# Patient Record
Sex: Male | Born: 1950 | Race: White | Hispanic: No | Marital: Married | Smoking: Former smoker
Health system: Southern US, Community
[De-identification: ages and names within clinical notes are randomized; demographics above are authoritative.]

## PROBLEM LIST (undated history)

## (undated) DIAGNOSIS — I10 Essential (primary) hypertension: Secondary | ICD-10-CM

## (undated) DIAGNOSIS — N400 Enlarged prostate without lower urinary tract symptoms: Secondary | ICD-10-CM

## (undated) DIAGNOSIS — G629 Polyneuropathy, unspecified: Secondary | ICD-10-CM

## (undated) DIAGNOSIS — M1990 Unspecified osteoarthritis, unspecified site: Secondary | ICD-10-CM

## (undated) DIAGNOSIS — M51379 Other intervertebral disc degeneration, lumbosacral region without mention of lumbar back pain or lower extremity pain: Secondary | ICD-10-CM

## (undated) DIAGNOSIS — R972 Elevated prostate specific antigen [PSA]: Secondary | ICD-10-CM

## (undated) DIAGNOSIS — E119 Type 2 diabetes mellitus without complications: Secondary | ICD-10-CM

## (undated) DIAGNOSIS — K219 Gastro-esophageal reflux disease without esophagitis: Secondary | ICD-10-CM

## (undated) DIAGNOSIS — R399 Unspecified symptoms and signs involving the genitourinary system: Secondary | ICD-10-CM

## (undated) DIAGNOSIS — N529 Male erectile dysfunction, unspecified: Secondary | ICD-10-CM

## (undated) DIAGNOSIS — K449 Diaphragmatic hernia without obstruction or gangrene: Secondary | ICD-10-CM

## (undated) DIAGNOSIS — M5137 Other intervertebral disc degeneration, lumbosacral region: Secondary | ICD-10-CM

## (undated) HISTORY — PX: INGUINAL HERNIA REPAIR: SUR1180

## (undated) HISTORY — PX: TONSILLECTOMY: SUR1361

---
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
## 2011-11-11 HISTORY — PX: INGUINAL HERNIA REPAIR: SUR1180

## 2013-05-13 HISTORY — PX: CATARACT EXTRACTION W/ INTRAOCULAR LENS IMPLANT: SHX1309

## 2019-01-12 DIAGNOSIS — Z85528 Personal history of other malignant neoplasm of kidney: Secondary | ICD-10-CM

## 2019-01-12 HISTORY — DX: Personal history of other malignant neoplasm of kidney: Z85.528

## 2019-02-02 ENCOUNTER — Other Ambulatory Visit: Payer: Self-pay | Admitting: Internal Medicine

## 2019-02-02 DIAGNOSIS — N2889 Other specified disorders of kidney and ureter: Secondary | ICD-10-CM

## 2019-02-03 ENCOUNTER — Ambulatory Visit
Admission: RE | Admit: 2019-02-03 | Discharge: 2019-02-03 | Disposition: A | Discharge status: Home / Self Care | Payer: Commercial Managed Care - PPO | Source: Ambulatory Visit | Attending: Internal Medicine | Admitting: Internal Medicine

## 2019-02-03 DIAGNOSIS — N2889 Other specified disorders of kidney and ureter: Secondary | ICD-10-CM

## 2019-02-03 MED ORDER — IOPAMIDOL (ISOVUE-300) INJECTION 61%
100.0000 mL | Freq: Once | INTRAVENOUS | Status: AC | PRN
  Administered 2019-02-03: 12:00:00 100 mL via INTRAVENOUS

## 2019-03-17 HISTORY — PX: ROBOTIC ASSITED PARTIAL NEPHRECTOMY: SHX6087

## 2019-12-28 ENCOUNTER — Other Ambulatory Visit: Payer: Self-pay | Admitting: Urology

## 2019-12-28 DIAGNOSIS — R972 Elevated prostate specific antigen [PSA]: Secondary | ICD-10-CM

## 2020-02-01 ENCOUNTER — Other Ambulatory Visit: Payer: Self-pay

## 2020-02-01 ENCOUNTER — Ambulatory Visit
Admission: RE | Admit: 2020-02-01 | Discharge: 2020-02-01 | Disposition: A | Discharge status: Home / Self Care | Payer: Commercial Managed Care - PPO | Source: Ambulatory Visit | Attending: Urology | Admitting: Urology

## 2020-02-01 DIAGNOSIS — R972 Elevated prostate specific antigen [PSA]: Secondary | ICD-10-CM

## 2020-02-01 MED ORDER — GADOBENATE DIMEGLUMINE 529 MG/ML IV SOLN
16.0000 mL | Freq: Once | INTRAVENOUS | Status: AC | PRN
  Administered 2020-02-01: 16 mL via INTRAVENOUS

## 2021-06-25 ENCOUNTER — Ambulatory Visit: Payer: Self-pay | Admitting: Surgery

## 2021-07-17 ENCOUNTER — Other Ambulatory Visit: Payer: Self-pay

## 2021-07-17 ENCOUNTER — Encounter (HOSPITAL_BASED_OUTPATIENT_CLINIC_OR_DEPARTMENT_OTHER): Payer: Self-pay | Admitting: Surgery

## 2021-07-17 NOTE — Progress Notes (Addendum)
Spoke w/ via phone for pre-op interview--- pt ?Lab needs dos----   [employer] and ekg            ?Lab results------ no ?COVID test -----patient states asymptomatic no test needed ?Arrive at ------- 0900 on 07-20-2021 ?NPO after MN NO Solid Food and no dip tobacco Clear liquids from MN until--- 0800 ?Med rec completed ?Medications to take morning of surgery ----- proscar, flomax, cialis ?Diabetic medication ----- do half dose lantus insulin morning of surgery and do not do  lispro insulin morning of surgery ?Patient instructed no nail polish to be worn day of surgery ?Patient instructed to bring photo id and insurance card day of surgery ?Patient aware to have Driver (ride ) / caregiver for 24 hours after surgery -- wife, cindee ?Patient Special Instructions ----- n/a ?Pre-Op special Istructions -----  pt has Libre II on left upper arm ?Patient verbalized understanding of instructions that were given at this phone interview. ?Patient denies shortness of breath, chest pain, fever, cough at this phone interview.  ?

## 2021-07-20 ENCOUNTER — Encounter (HOSPITAL_BASED_OUTPATIENT_CLINIC_OR_DEPARTMENT_OTHER)
Admission: RE | Disposition: A | Discharge status: Home / Self Care | Payer: Self-pay | Source: Home / Self Care | Attending: Surgery

## 2021-07-20 ENCOUNTER — Ambulatory Visit (HOSPITAL_BASED_OUTPATIENT_CLINIC_OR_DEPARTMENT_OTHER)
Admission: RE | Admit: 2021-07-20 | Discharge: 2021-07-20 | Disposition: A | Discharge status: Home / Self Care | Payer: Medicare Other | Attending: Surgery | Admitting: Surgery

## 2021-07-20 ENCOUNTER — Ambulatory Visit (HOSPITAL_BASED_OUTPATIENT_CLINIC_OR_DEPARTMENT_OTHER): Payer: Medicare Other | Admitting: Anesthesiology

## 2021-07-20 ENCOUNTER — Other Ambulatory Visit: Payer: Self-pay

## 2021-07-20 ENCOUNTER — Encounter (HOSPITAL_BASED_OUTPATIENT_CLINIC_OR_DEPARTMENT_OTHER): Payer: Self-pay | Admitting: Surgery

## 2021-07-20 DIAGNOSIS — Z85528 Personal history of other malignant neoplasm of kidney: Secondary | ICD-10-CM | POA: Insufficient documentation

## 2021-07-20 DIAGNOSIS — I10 Essential (primary) hypertension: Secondary | ICD-10-CM | POA: Diagnosis not present

## 2021-07-20 DIAGNOSIS — Z794 Long term (current) use of insulin: Secondary | ICD-10-CM | POA: Diagnosis not present

## 2021-07-20 DIAGNOSIS — K4090 Unilateral inguinal hernia, without obstruction or gangrene, not specified as recurrent: Secondary | ICD-10-CM | POA: Insufficient documentation

## 2021-07-20 DIAGNOSIS — Z905 Acquired absence of kidney: Secondary | ICD-10-CM | POA: Insufficient documentation

## 2021-07-20 DIAGNOSIS — K219 Gastro-esophageal reflux disease without esophagitis: Secondary | ICD-10-CM | POA: Insufficient documentation

## 2021-07-20 DIAGNOSIS — M1990 Unspecified osteoarthritis, unspecified site: Secondary | ICD-10-CM | POA: Diagnosis not present

## 2021-07-20 DIAGNOSIS — E1140 Type 2 diabetes mellitus with diabetic neuropathy, unspecified: Secondary | ICD-10-CM | POA: Insufficient documentation

## 2021-07-20 DIAGNOSIS — Z87891 Personal history of nicotine dependence: Secondary | ICD-10-CM | POA: Insufficient documentation

## 2021-07-20 HISTORY — DX: Gastro-esophageal reflux disease without esophagitis: K21.9

## 2021-07-20 HISTORY — DX: Benign prostatic hyperplasia without lower urinary tract symptoms: N40.0

## 2021-07-20 HISTORY — PX: INGUINAL HERNIA REPAIR: SHX194

## 2021-07-20 HISTORY — DX: Other intervertebral disc degeneration, lumbosacral region: M51.37

## 2021-07-20 HISTORY — DX: Other intervertebral disc degeneration, lumbosacral region without mention of lumbar back pain or lower extremity pain: M51.379

## 2021-07-20 HISTORY — DX: Essential (primary) hypertension: I10

## 2021-07-20 HISTORY — DX: Male erectile dysfunction, unspecified: N52.9

## 2021-07-20 HISTORY — DX: Diaphragmatic hernia without obstruction or gangrene: K44.9

## 2021-07-20 HISTORY — DX: Type 2 diabetes mellitus without complications: E11.9

## 2021-07-20 HISTORY — DX: Polyneuropathy, unspecified: G62.9

## 2021-07-20 HISTORY — DX: Unspecified osteoarthritis, unspecified site: M19.90

## 2021-07-20 HISTORY — DX: Unspecified symptoms and signs involving the genitourinary system: R39.9

## 2021-07-20 HISTORY — DX: Elevated prostate specific antigen (PSA): R97.20

## 2021-07-20 LAB — POCT I-STAT, CHEM 8
BUN: 13 mg/dL (ref 8–23)
Calcium, Ion: 1.21 mmol/L (ref 1.15–1.40)
Chloride: 100 mmol/L (ref 98–111)
Creatinine, Ser: 0.7 mg/dL (ref 0.61–1.24)
Glucose, Bld: 221 mg/dL — ABNORMAL HIGH (ref 70–99)
HCT: 40 % (ref 39.0–52.0)
Hemoglobin: 13.6 g/dL (ref 13.0–17.0)
Potassium: 4.5 mmol/L (ref 3.5–5.1)
Sodium: 139 mmol/L (ref 135–145)
TCO2: 29 mmol/L (ref 22–32)

## 2021-07-20 LAB — GLUCOSE, CAPILLARY: Glucose-Capillary: 200 mg/dL — ABNORMAL HIGH (ref 70–99)

## 2021-07-20 SURGERY — REPAIR, HERNIA, INGUINAL, LAPAROSCOPIC
Anesthesia: General | Laterality: Left

## 2021-07-20 MED ORDER — ONDANSETRON HCL 4 MG/2ML IJ SOLN: 4.0000 mg | Freq: Once | INTRAMUSCULAR | Status: DC | PRN

## 2021-07-20 MED ORDER — PROPOFOL 10 MG/ML IV BOLUS
INTRAVENOUS | Status: DC | PRN
  Administered 2021-07-20: 150 mg via INTRAVENOUS

## 2021-07-20 MED ORDER — CHLORHEXIDINE GLUCONATE CLOTH 2 % EX PADS: 6.0000 | MEDICATED_PAD | Freq: Once | CUTANEOUS | Status: DC

## 2021-07-20 MED ORDER — PHENYLEPHRINE 40 MCG/ML (10ML) SYRINGE FOR IV PUSH (FOR BLOOD PRESSURE SUPPORT)
PREFILLED_SYRINGE | INTRAVENOUS | Status: AC
  Filled 2021-07-20: qty 10

## 2021-07-20 MED ORDER — ONDANSETRON HCL 4 MG/2ML IJ SOLN
INTRAMUSCULAR | Status: AC
  Filled 2021-07-20: qty 2

## 2021-07-20 MED ORDER — PHENYLEPHRINE 40 MCG/ML (10ML) SYRINGE FOR IV PUSH (FOR BLOOD PRESSURE SUPPORT)
PREFILLED_SYRINGE | INTRAVENOUS | Status: DC | PRN
  Administered 2021-07-20: 80 ug via INTRAVENOUS
  Administered 2021-07-20: 120 ug via INTRAVENOUS
  Administered 2021-07-20: 80 ug via INTRAVENOUS

## 2021-07-20 MED ORDER — OXYCODONE HCL 5 MG PO TABS: 5.0000 mg | ORAL_TABLET | Freq: Once | ORAL | Status: DC | PRN

## 2021-07-20 MED ORDER — SUGAMMADEX SODIUM 200 MG/2ML IV SOLN
INTRAVENOUS | Status: DC | PRN
Start: 2021-07-20 — End: 2021-07-20
  Administered 2021-07-20: 150 mg via INTRAVENOUS

## 2021-07-20 MED ORDER — FENTANYL CITRATE (PF) 100 MCG/2ML IJ SOLN
INTRAMUSCULAR | Status: AC
  Filled 2021-07-20: qty 2

## 2021-07-20 MED ORDER — AMISULPRIDE (ANTIEMETIC) 5 MG/2ML IV SOLN: 10.0000 mg | Freq: Once | INTRAVENOUS | Status: DC | PRN

## 2021-07-20 MED ORDER — LACTATED RINGERS IV SOLN: INTRAVENOUS | Status: DC

## 2021-07-20 MED ORDER — MIDAZOLAM HCL 5 MG/5ML IJ SOLN
INTRAMUSCULAR | Status: DC | PRN
  Administered 2021-07-20: 1 mg via INTRAVENOUS

## 2021-07-20 MED ORDER — EPHEDRINE SULFATE (PRESSORS) 50 MG/ML IJ SOLN
INTRAMUSCULAR | Status: DC | PRN
Start: 2021-07-20 — End: 2021-07-20
  Administered 2021-07-20: 10 mg via INTRAVENOUS

## 2021-07-20 MED ORDER — DEXAMETHASONE SODIUM PHOSPHATE 10 MG/ML IJ SOLN
INTRAMUSCULAR | Status: AC
Start: 2021-07-20 — End: ?
  Filled 2021-07-20: qty 1

## 2021-07-20 MED ORDER — PROPOFOL 500 MG/50ML IV EMUL
INTRAVENOUS | Status: AC
  Filled 2021-07-20: qty 50

## 2021-07-20 MED ORDER — CELECOXIB 200 MG PO CAPS
ORAL_CAPSULE | ORAL | Status: AC
Start: 2021-07-20 — End: ?
  Filled 2021-07-20: qty 2

## 2021-07-20 MED ORDER — OXYCODONE HCL 5 MG/5ML PO SOLN: 5.0000 mg | Freq: Once | ORAL | Status: DC | PRN

## 2021-07-20 MED ORDER — ROCURONIUM BROMIDE 10 MG/ML (PF) SYRINGE
PREFILLED_SYRINGE | INTRAVENOUS | Status: AC
  Filled 2021-07-20: qty 10

## 2021-07-20 MED ORDER — ONDANSETRON HCL 4 MG/2ML IJ SOLN
INTRAMUSCULAR | Status: DC | PRN
  Administered 2021-07-20: 4 mg via INTRAVENOUS

## 2021-07-20 MED ORDER — EPHEDRINE 5 MG/ML INJ
INTRAVENOUS | Status: AC
  Filled 2021-07-20: qty 5

## 2021-07-20 MED ORDER — ACETAMINOPHEN 500 MG PO TABS
1000.0000 mg | ORAL_TABLET | ORAL | Status: AC
  Administered 2021-07-20: 1000 mg via ORAL

## 2021-07-20 MED ORDER — CELECOXIB 200 MG PO CAPS
400.0000 mg | ORAL_CAPSULE | ORAL | Status: AC
  Administered 2021-07-20: 400 mg via ORAL

## 2021-07-20 MED ORDER — FENTANYL CITRATE (PF) 100 MCG/2ML IJ SOLN
INTRAMUSCULAR | Status: DC | PRN
  Administered 2021-07-20 (×2): 25 ug via INTRAVENOUS
  Administered 2021-07-20: 50 ug via INTRAVENOUS

## 2021-07-20 MED ORDER — BUPIVACAINE LIPOSOME 1.3 % IJ SUSP: 20.0000 mL | Freq: Once | INTRAMUSCULAR | Status: DC

## 2021-07-20 MED ORDER — MIDAZOLAM HCL 2 MG/2ML IJ SOLN
INTRAMUSCULAR | Status: AC
  Filled 2021-07-20: qty 2

## 2021-07-20 MED ORDER — LIDOCAINE HCL (PF) 2 % IJ SOLN
INTRAMUSCULAR | Status: AC
  Filled 2021-07-20: qty 5

## 2021-07-20 MED ORDER — BUPIVACAINE HCL 0.5 % IJ SOLN
INTRAMUSCULAR | Status: DC | PRN
  Administered 2021-07-20: 30 mL

## 2021-07-20 MED ORDER — ACETAMINOPHEN 500 MG PO TABS
ORAL_TABLET | ORAL | Status: AC
  Filled 2021-07-20: qty 2

## 2021-07-20 MED ORDER — ROCURONIUM BROMIDE 10 MG/ML (PF) SYRINGE
PREFILLED_SYRINGE | INTRAVENOUS | Status: DC | PRN
  Administered 2021-07-20: 70 mg via INTRAVENOUS

## 2021-07-20 MED ORDER — INSULIN ASPART 100 UNIT/ML IJ SOLN
3.0000 [IU] | Freq: Once | INTRAMUSCULAR | Status: AC
  Administered 2021-07-20: 3 [IU] via SUBCUTANEOUS

## 2021-07-20 MED ORDER — OXYCODONE-ACETAMINOPHEN 5-325 MG PO TABS: 1.0000 | ORAL_TABLET | ORAL | 0 refills | Status: AC | PRN

## 2021-07-20 MED ORDER — CEFAZOLIN SODIUM-DEXTROSE 2-4 GM/100ML-% IV SOLN
INTRAVENOUS | Status: AC
  Filled 2021-07-20: qty 100

## 2021-07-20 MED ORDER — LIDOCAINE 2% (20 MG/ML) 5 ML SYRINGE
INTRAMUSCULAR | Status: DC | PRN
  Administered 2021-07-20: 80 mg via INTRAVENOUS

## 2021-07-20 MED ORDER — FENTANYL CITRATE (PF) 100 MCG/2ML IJ SOLN: 25.0000 ug | INTRAMUSCULAR | Status: DC | PRN

## 2021-07-20 MED ORDER — CEFAZOLIN SODIUM-DEXTROSE 2-4 GM/100ML-% IV SOLN
2.0000 g | INTRAVENOUS | Status: AC
  Administered 2021-07-20: 2 g via INTRAVENOUS

## 2021-07-20 MED ORDER — BUPIVACAINE LIPOSOME 1.3 % IJ SUSP
INTRAMUSCULAR | Status: DC | PRN
  Administered 2021-07-20: 20 mL

## 2021-07-20 SURGICAL SUPPLY — 40 items
ADH SKN CLS APL DERMABOND .7 (GAUZE/BANDAGES/DRESSINGS) ×1
APL PRP STRL LF DISP 70% ISPRP (MISCELLANEOUS) ×1
BLADE CLIPPER SENSICLIP SURGIC (BLADE) ×2 IMPLANT
CABLE HIGH FREQUENCY MONO STRZ (ELECTRODE) ×3 IMPLANT
CHLORAPREP W/TINT 26 (MISCELLANEOUS) ×3 IMPLANT
DECANTER SPIKE VIAL GLASS SM (MISCELLANEOUS) IMPLANT
DERMABOND ADVANCED (GAUZE/BANDAGES/DRESSINGS) ×2
DERMABOND ADVANCED .7 DNX12 (GAUZE/BANDAGES/DRESSINGS) ×1 IMPLANT
ELECT REM PT RETURN 9FT ADLT (ELECTROSURGICAL) ×3
ELECTRODE REM PT RTRN 9FT ADLT (ELECTROSURGICAL) ×1 IMPLANT
GAUZE 4X4 16PLY ~~LOC~~+RFID DBL (SPONGE) ×3 IMPLANT
GLOVE SRG 8 PF TXTR STRL LF DI (GLOVE) ×1 IMPLANT
GLOVE SURG ENC MOIS LTX SZ7.5 (GLOVE) ×3 IMPLANT
GLOVE SURG UNDER POLY LF SZ8 (GLOVE) ×3
GOWN STRL REUS W/ TWL XL LVL3 (GOWN DISPOSABLE) ×1 IMPLANT
GOWN STRL REUS W/TWL XL LVL3 (GOWN DISPOSABLE) ×3
GRASPER SUT TROCAR 14GX15 (MISCELLANEOUS) ×3 IMPLANT
IRRIG SUCT STRYKERFLOW 2 WTIP (MISCELLANEOUS)
IRRIGATION SUCT STRKRFLW 2 WTP (MISCELLANEOUS) IMPLANT
KIT TURNOVER CYSTO (KITS) ×3 IMPLANT
MESH 3DMAX 5X7 LT XLRG (Mesh General) ×2 IMPLANT
NEEDLE INSUFFLATION 120MM (ENDOMECHANICALS) ×3 IMPLANT
PACK BASIN DAY SURGERY FS (CUSTOM PROCEDURE TRAY) ×3 IMPLANT
PAD POSITIONING PINK XL (MISCELLANEOUS) ×3 IMPLANT
RELOAD STAPLE 4.0 BLU F/HERNIA (INSTRUMENTS) IMPLANT
RELOAD STAPLE 4.8 BLK F/HERNIA (STAPLE) IMPLANT
RELOAD STAPLE HERNIA 4.0 BLUE (INSTRUMENTS) ×3 IMPLANT
RELOAD STAPLE HERNIA 4.8 BLK (STAPLE) ×3 IMPLANT
SCISSORS LAP 5X35 DISP (ENDOMECHANICALS) ×3 IMPLANT
SET TUBE SMOKE EVAC HIGH FLOW (TUBING) ×3 IMPLANT
SPONGE T-LAP 18X18 ~~LOC~~+RFID (SPONGE) IMPLANT
STAPLER HERNIA 12 8.5 360D (INSTRUMENTS) ×2 IMPLANT
SUT MNCRL AB 4-0 PS2 18 (SUTURE) ×5 IMPLANT
SUT VICRYL 0 UR6 27IN ABS (SUTURE) ×2 IMPLANT
TOWEL OR 17X26 10 PK STRL BLUE (TOWEL DISPOSABLE) ×3 IMPLANT
TRAY FOL W/BAG SLVR 16FR STRL (SET/KITS/TRAYS/PACK) IMPLANT
TRAY FOLEY W/BAG SLVR 16FR LF (SET/KITS/TRAYS/PACK)
TRAY LAPAROSCOPIC (CUSTOM PROCEDURE TRAY) ×3 IMPLANT
TROCAR BLADELESS OPT 12M 100M (ENDOMECHANICALS) ×3 IMPLANT
TROCAR BLADELESS OPT 5 100 (ENDOMECHANICALS) ×6 IMPLANT

## 2021-07-20 NOTE — Anesthesia Procedure Notes (Signed)
Procedure Name: Intubation ?Date/Time: 07/20/2021 10:43 AM ?Performed by: Bonney Aid, CRNA ?Pre-anesthesia Checklist: Patient identified, Emergency Drugs available, Suction available and Patient being monitored ?Patient Re-evaluated:Patient Re-evaluated prior to induction ?Oxygen Delivery Method: Circle system utilized ?Preoxygenation: Pre-oxygenation with 100% oxygen ?Induction Type: IV induction ?Ventilation: Mask ventilation without difficulty ?Laryngoscope Size: Glidescope and 3 ?Grade View: Grade I ?Tube type: Oral ?Tube size: 7.5 mm ?Number of attempts: 1 ?Airway Equipment and Method: Stylet ?Placement Confirmation: ETT inserted through vocal cords under direct vision, positive ETCO2 and breath sounds checked- equal and bilateral ?Secured at: 22 cm ?Tube secured with: Tape ?Dental Injury: Teeth and Oropharynx as per pre-operative assessment  ? ? ? ? ?

## 2021-07-20 NOTE — Discharge Instructions (Addendum)
 GROIN HERNIA REPAIR POST OPERATIVE INSTRUCTIONS  Thinking Clearly  The anesthesia may cause you to feel different for 1 or 2 days. Do not drive, drink alcohol, or make any big decisions for at least 2 days.  Nutrition When you wake up, you will be able to drink small amounts of liquid. If you do not feel sick, you can slowly advance your diet to regular foods. Continue to drink lots of fluids, usually about 8 to 10 glasses per day. Eat a high-fiber diet so you don't strain during bowel movements. High-Fiber Foods Foods high in fiber include beans, bran cereals and whole-grain breads, peas, dried fruit (figs, apricots, and dates), raspberries, blackberries, strawberries, sweet corn, broccoli, baked potatoes with skin, plums, pears, apples, greens, and nuts. Activity Slowly increase your activity. Be sure to get up and walk every hour or so to prevent blood clots. No heavy lifting or strenuous activity for 4 weeks following surgery to prevent hernias at your incision sites or recurrence of your hernia. It is normal to feel tired. You may need more sleep than usual.  Get your rest but make sure to get up and move around frequently to prevent blood clots and pneumonia.  Work and Return to School You can go back to work when you feel well enough. Discuss the timing with your surgeon. You can usually go back to school or work 1 week or less after an laparoscopic or an open repair. If your work requires heavy lifting or strenuous activity you need to be placed on light duty for 4 weeks following surgery. You can return to gym class, sports or other physical activities 4 weeks after surgery.  Wound Care You may experience significant bruising in the groin including into the scrotum in males.  Rest, elevating the groin and scrotum above the level of the heart, ice and compression with tight fitting underwear can help.  Always wash your hands before and after touching near your incision site. Do  not soak in a bathtub until cleared at your follow up appointment. You may take a shower 24 hours after surgery. A small amount of drainage from the incision is normal. If the drainage is thick and yellow or the site is red, you may have an infection, so call your surgeon. If you have a drain in one of your incisions, it will be taken out in office when the drainage stops. Steri-Strips will fall off in 7 to 10 days or they will be removed during your first office visit. If you have dermabond glue covering over the incision, allow the glue to flake off on its own. Protect the new skin, especially from the sun. The sun can burn and cause darker scarring. Your scar will heal in about 4 to 6 weeks and will become softer and continue to fade over the next year.  The cosmetic appearance of the incisions will improve over the course of the first year after surgery. Sensation around your incision will return in a few weeks or months.  Bowel Movements After intestinal surgery, you may have loose watery stools for several days. If watery diarrhea lasts longer than 3 days, contact your surgeon. Pain medication (narcotics) can cause constipation. Increase the fiber in your diet with high-fiber foods if you are constipated. You can take an over the counter stool softener like Colace to avoid constipation.  Additional over the counter medications can also be used if Colace isn't sufficient (for example, Milk of Magnesia or Miralax).    Pain The amount of pain is different for each person. Some people need only 1 to 3 doses of pain control medication, while others need more. Take alternating doses of tylenol and ibuprofen around the clock for the first five days following surgery.  This will provide a baseline of pain control and help with inflammation.  Take the narcotic pain medication in addition if needed for severe pain.  Contact Your Surgeon at [phone], if you have: Pain that will not go away Pain that  gets worse A fever of more than 101F (38.3C) Repeated vomiting Swelling, redness, bleeding, or bad-smelling drainage from your wound site Strong abdominal pain No bowel movement or unable to pass gas for 3 days Watery diarrhea lasting longer than 3 days  Pain Control The goal of pain control is to minimize pain, keep you moving and help you heal. Your surgical team will work with you on your pain plan. Most often a combination of therapies and medications are used to control your pain. You may also be given medication (local anesthetic) at the surgical site. This may help control your pain for several days. Extreme pain puts extra stress on your body at a time when your body needs to focus on healing. Do not wait until your pain has reached a level "10" or is unbearable before telling your doctor or nurse. It is much easier to control pain before it becomes severe. Following a laparoscopic procedure, pain is sometimes felt in the shoulder. This is due to the gas inserted into your abdomen during the procedure. Moving and walking helps to decrease the gas and the right shoulder pain.  Use the guide below for ways to manage your post-operative pain. Learn more by going to facs.org/safepaincontrol.  How Intense Is My Pain Common Therapies to Feel Better       I hardly notice my pain, and it does not interfere with my activities.  I notice my pain and it distracts me, but I can still do activities (sitting up, walking, standing).  Non-Medication Therapies  Ice (in a bag, applied over clothing at the surgical site), elevation, rest, meditation, massage, distraction (music, TV, play) walking and mild exercise Splinting the abdomen with pillows +  Non-Opioid Medications Acetaminophen (Tylenol) Non-steroidal anti-inflammatory drugs (NSAIDS) Aspirin, Ibuprofen (Motrin, Advil) Naproxen (Aleve) Take these as needed, when you feel pain. Both acetaminophen and NSAIDs help to decrease pain  and swelling (inflammation).      My pain is hard to ignore and is more noticeable even when I rest.  My pain interferes with my usual activities.  Non-Medication Therapies  +  Non-Opioid medications  Take on a regular schedule (around-the-clock) instead of as needed. (For example, Tylenol every 6 hours at 9:00 am, 3:00 pm, 9:00 pm, 3:00 am and Motrin every 6 hours at 12:00 am, 6:00 am, 12:00 pm, 6:00 pm)         I am focused on my pain, and I am not doing my daily activities.  I am groaning in pain, and I cannot sleep. I am unable to do anything.  My pain is as bad as it could be, and nothing else matters.  Non-Medication Therapies  +  Around-the-Clock Non-Opioid Medications  +  Short-acting opioids  Opioids should be used with other medications to manage severe pain. Opioids block pain and give a feeling of euphoria (feel high). Addiction, a serious side effect of opioids, is rare with short-term (a few days) use.  Examples of short-acting opioids   include: Tramadol (Ultram), Hydrocodone (Norco, Vicodin), Hydromorphone (Dilaudid), Oxycodone (Oxycontin)     The above directions have been adapted from the [employer] Surgical Patient Education Program.  Please refer to the ACS website if needed: http://chapman.info/.ashx   Ivar Drape, MD [facility] [address], [place], [place]  [address] ?  [address] [address], [place], [place]   [address] [phone] ? [phone] ? FAX [phone] Web site: [email or web address]     NO TYLENOL PRODUCTS UNTIL AFTER 3:40 PM THIS AFTERNOON.  NO IBUPROFEN PRODUCTS UNTIL AFTER 3:40 PM THIS AFTERNOON. (IBUPROFEN, ADVIL, MOTRIN, ALEVE, NAPROXEN, MELOXICAM, CELEBREX)     Post Anesthesia Home Care Instructions  Activity: Get plenty of rest for the remainder of the day. A responsible individual must stay  with you for 24 hours following the procedure.  For the next 24 hours, DO NOT: -Drive a car -Advertising copywriter -Drink alcoholic beverages -Take any medication unless instructed by your physician -Make any legal decisions or sign important papers.  Meals: Start with liquid foods such as gelatin or soup. Progress to regular foods as tolerated. Avoid greasy, spicy, heavy foods. If nausea and/or vomiting occur, drink only clear liquids until the nausea and/or vomiting subsides. Call your physician if vomiting continues.  Special Instructions/Symptoms: Your throat may feel dry or sore from the anesthesia or the breathing tube placed in your throat during surgery. If this causes discomfort, gargle with warm salt water. The discomfort should disappear within 24 hours.      Information for Discharge Teaching: EXPAREL (bupivacaine liposome injectable suspension)   Your surgeon or anesthesiologist gave you EXPAREL(bupivacaine) to help control your pain after surgery.  EXPAREL is a local anesthetic that provides pain relief by numbing the tissue around the surgical site. EXPAREL is designed to release pain medication over time and can control pain for up to 72 hours. Depending on how you respond to EXPAREL, you may require less pain medication during your recovery.  Possible side effects: Temporary loss of sensation or ability to move in the area where bupivacaine was injected. Nausea, vomiting, constipation Rarely, numbness and tingling in your mouth or lips, lightheadedness, or anxiety may occur. Call your doctor right away if you think you may be experiencing any of these sensations, or if you have other questions regarding possible side effects.  Follow all other discharge instructions given to you by your surgeon or nurse. Eat a healthy diet and drink plenty of water or other fluids.  If you return to the hospital for any reason within 96 hours following the administration of EXPAREL, it is  important for health care providers to know that you have received this anesthetic. A teal colored band has been placed on your arm with the date, time and amount of EXPAREL you have received in order to alert and inform your health care providers. Please leave this armband in place for the full 96 hours following administration, and then you may remove the band.

## 2021-07-20 NOTE — H&P (Signed)
? ?Admitting Physician: Nickola Major Judge Duque ? ?Service: General surgery ? ?CC: Left inguinal hernia ? ?Subjective  ? ?HPI: ?Curtis Hutchinson is an 71 y.o. male who is here for left inguinal hernia repair. ? ?Past Medical History:  ?Diagnosis Date  ? BPH (benign prostatic hyperplasia)   ? followed by urologist --- Curtis Hutchinson in [place]  ? DDD (degenerative disc disease), lumbosacral   ? ED (erectile dysfunction)   ? Elevated PSA   ? GERD (gastroesophageal reflux disease)   ? Hiatal hernia   ? History of renal cell carcinoma 01/2019  ? urologist--- [place] Hutch[place] ([facility] [place]  dx 09/ 2020 incidental finding on mri lumbar imageing;   03-17-2019  s/p right partial nephrectomy  ? Hypertension   ? Insulin dependent type 2 diabetes mellitus (Curtis Hutchinson)   ? followed by pcp  (07-17-2021  checks 5-6 times daily with Libre II,  fasting sugar--- 120-150)  ? Lower urinary tract symptoms (LUTS)   ? OA (osteoarthritis)   ? Peripheral neuropathy   ? ? ?Past Surgical History:  ?Procedure Laterality Date  ? APPENDECTOMY  1966  ? CATARACT EXTRACTION W/ INTRAOCULAR LENS IMPLANT Bilateral 2015  ? INGUINAL HERNIA REPAIR Right 11/2011  ? INGUINAL HERNIA REPAIR Left   ? age 31  ? ROBOTIC ASSITED PARTIAL NEPHRECTOMY Right 03/17/2019  ? @[facility]   ? TONSILLECTOMY    ? child  ? ? ?History reviewed. No pertinent family history. ? ?Social:  reports that he quit smoking about 38 years ago. His smoking use included cigarettes. His smokeless tobacco use includes snuff. He reports current alcohol use of about 7.0 standard drinks per week. He reports that he does not use drugs. ? ?Allergies: No Known Allergies ? ?Medications: ?Current Outpatient Medications  ?Medication Instructions  ? amLODipine (NORVASC) 2.5 mg, Oral, Daily  ? aspirin 325 mg, Oral, As needed  ? atorvastatin (LIPITOR) 40 mg, Oral, Daily at bedtime  ? finasteride (PROSCAR) 5 mg, Oral, Daily  ? insulin glargine (LANTUS) 20 Units, Subcutaneous, Daily  ? insulin lispro (HUMALOG) 100 UNIT/ML  KwikPen Subcutaneous, 5 times daily, PER SS  ? lisinopril-hydrochlorothiazide (ZESTORETIC) 20-12.5 MG tablet 1 tablet, Oral, 2 times daily  ? naproxen sodium (ALEVE) 220 mg, Oral, 2 times daily PRN  ? omeprazole (PRILOSEC) 20 mg, Oral, Daily at bedtime  ? tadalafil (CIALIS) 5 mg, Oral, Daily  ? tamsulosin (FLOMAX) 0.4 mg, Oral, 2 times daily  ? ? ?ROS - all of the below systems have been reviewed with the patient and positives are indicated with bold text ?General: chills, fever or night sweats ?Eyes: blurry vision or double vision ?ENT: epistaxis or sore throat ?Allergy/Immunology: itchy/watery eyes or nasal congestion ?Hematologic/Lymphatic: bleeding problems, blood clots or swollen lymph nodes ?Endocrine: temperature intolerance or unexpected weight changes ?Breast: new or changing breast lumps or nipple discharge ?Resp: cough, shortness of breath, or wheezing ?CV: chest pain or dyspnea on exertion ?GI: as per HPI ?GU: dysuria, trouble voiding, or hematuria ?MSK: joint pain or joint stiffness ?Neuro: TIA or stroke symptoms ?Derm: pruritus and skin lesion changes ?Psych: anxiety and depression ? ?Objective  ? ?PE ?Blood pressure (!) 171/90, pulse 93, temperature 98.2 ?F (36.8 ?C), temperature source Oral, resp. rate 16, height 5\' 8"  (1.727 m), weight 71.1 kg, SpO2 99 %. ?Constitutional: NAD; conversant; no deformities ?Eyes: Moist conjunctiva; no lid lag; anicteric; PERRL ?Neck: Trachea midline; no thyromegaly ?Lungs: Normal respiratory effort; no tactile fremitus ?CV: RRR; no palpable thrills; no pitting edema ?GI: Abd Left  inguinal hernia marked; no palpable hepatosplenomegaly ?MSK: Normal range of motion of extremities; no clubbing/cyanosis ?Psychiatric: Appropriate affect; alert and oriented x3 ?Lymphatic: No palpable cervical or axillary lymphadenopathy ? ?Results for orders placed or performed during the hospital encounter of 07/20/21 (from the past 24 hour(s))  ?I-STAT, chem 8     Status: Abnormal  ?  Collection Time: 07/20/21  9:36 AM  ?Result Value Ref Range  ? Sodium 139 135 - 145 mmol/L  ? Potassium 4.5 3.5 - 5.1 mmol/L  ? Chloride 100 98 - 111 mmol/L  ? BUN 13 8 - 23 mg/dL  ? Creatinine, Ser 0.70 0.61 - 1.24 mg/dL  ? Glucose, Bld 221 (H) 70 - 99 mg/dL  ? Calcium, Ion 1.21 1.15 - 1.40 mmol/L  ? TCO2 29 22 - 32 mmol/L  ? Hemoglobin 13.6 13.0 - 17.0 g/dL  ? HCT 40.0 39.0 - 52.0 %  ? ? ?Imaging Orders  ?No imaging studies ordered today  ? ?MRI Prostate reviewed 02/01/20 ?No radiographic evidence of high-grade prostate carcinoma. PI-RADS ?1: Very Low (clinically significant cancer is highly unlikely to be ?Present) ? ?- looks to have fat containing left inguinal hernia with direct defect on this image ? ?Assessment and Plan  ? ?Curtis Hutchinson is an 71 y.o. male with a left inguinal hernia here for laparoscopic repair.  The procedure as well as its risks, benefits and alternatives were discussed with the patient. A fter a full discussion and all questions answered the patient granted consent to proceed.  We will proceed as scheduled. ? ?Curtis Morn, MD ? ?[facility] ?Use [email or web address] to contact on call provider ? ? ? ?

## 2021-07-20 NOTE — Transfer of Care (Signed)
Immediate Anesthesia Transfer of Care Note ? ?Patient: Curtis Hutchinson ? ?Procedure(s) Performed: LAPAROSCOPIC LEFT INGUINAL HERNIA REPAIR WITH MESH (Left) ? ?Patient Location: PACU ? ?Anesthesia Type:General ? ?Level of Consciousness: drowsy and patient cooperative ? ?Airway & Oxygen Therapy: Patient Spontanous Breathing and Patient connected to face mask oxygen ? ?Post-op Assessment: Report given to RN and Post -op Vital signs reviewed and stable ? ?Post vital signs: Reviewed and stable ? ?Last Vitals:  ?Vitals Value Taken Time  ?BP    ?Temp    ?Pulse 88 07/20/21 1158  ?Resp 16 07/20/21 1158  ?SpO2 99 % 07/20/21 1158  ?Vitals shown include unvalidated device data. ? ?Last Pain:  ?Vitals:  ? 07/20/21 0926  ?TempSrc: Oral  ?PainSc: 0-No pain  ?   ? ?  ? ?Complications: No notable events documented. ?

## 2021-07-20 NOTE — Op Note (Signed)
? ?  Patient: Curtis Hutchinson (07-01-1950, [id number]) ? ?Date of Surgery: 07/20/2021  ? ?Preoperative Diagnosis: LEFT INGUINAL HERNIA  ? ?Postoperative Diagnosis: LEFT INGUINAL HERNIA  ? ?Surgical Procedure: LAPAROSCOPIC LEFT INGUINAL HERNIA REPAIR WITH MESH: [id number]  ? ?Operative Team Members:  ?Surgeon(s) and Role: ?   * Keiva Dina, Hyman Hopes, MD - Primary  ? ?Anesthesiologist: Mellody Dance, MD ?CRNA: Marny Lowenstein, CRNA; Briant Sites, CRNA  ? ?Anesthesia: General  ? ?Fluids:  ?Total I/O ?In: 850 [I.V.:750; IV Piggyback:100] ?Out: 10 [Blood:10] ? ?Complications: None ? ?Drains:  None ? ?Specimen: None ? ?Disposition:  PACU - hemodynamically stable. ? ?Plan of Care: Discharge to home after PACU ? ?Indications for Procedure: Curtis Hutchinson is a 71 y.o. male who presented with a left inguinal hernia.  I recommended laparoscopic repair.  The procedure itself as well as its risks, benefits and alternatives were discussed and the patient granted consent to proceed. ? ?Findings:  ?Technique: Transabdominal preperitoneal (TAPP) ?Hernia Location: Left indirect inguinal hernia containing sigmoid colon ?Mesh Size &Type:  BARD 3D Max Extra-Large Left Sided Mesh ?Mesh Fixation: Endo-Universal hernia stapler ? ?Infection status: ?Patient: Private Patient Elective Case ?Case: Elective ?Infection Present At Time Of Surgery (PATOS): None ? ? ?Description of Procedure: ? ?The patient was positioned supine, padded and secured to the bed, with both arms tucked.  The abdomen was widely prepped and draped.  A time out procedure was performed.  A 1 cm infraumbilical incision was made.  The abdomen was entered without trauma to the underlying viscera.  The abdomen was insufflated to 15 mm of Hg.  A 12 mm trocar was inserted at the periumbilical incision.  Additional 5 mm trocars were placed in the left and right abdomen.  There was no trauma to the underlying viscera. ? ?There was an indirect hernia on the left.  The sigmoid  colon was scarred to the hernia sac.  These adhesions were lysed sharply prior to dissecting out the hernia to protect the colon.  With the colon mobilized out of the hernia sac, I utilized a transabdominal pre peritoneal technique (TAPP), a horizontal incision was made in the peritoneum, immediately below the umbilicus.  Dissection was carried out in the pre peritoneal space down to the level of the hernia sac which was reduced into the peritoneal cavity completely.  The cord contents were parietalized and preserved.  A large pre peritoneal dissection was performed to uncover the direct, indirect, femoral and obturator spaces.  Cooper?s ligament was uncovered medially and the psoas muscle uncovered laterally. ? ?The mesh, as documented above, was opened and advanced into the pre peritoneal position so that it more than adequately covered the indirect, direct, femoral and obturator spaces.  The mesh laid flat, with no inferior folds and covered the entire myopectineal orifice.  The mesh was fixated with the endo-universal hernia stapler to Cooper?s ligament and the posterior aspect of the rectus muscle.  The peritoneal flap was closed with the same device.  There were no peritoneal defects or exposed mesh at the conclusion. ? ?The umbilical trocar was removed and the fascial defect was closed with a 0 Vicryl suture.  The peritoneal cavity was completely desufflated, the trocars removed and the skin closed with 4-0 Monocryl subcuticular suture and skin glue.  All sponge and needle counts were correct at the end of the case. ? ?Ivar Drape, MD ?General, Bariatric, & Minimally Invasive Surgery ?[facility], [place] ? ?

## 2021-07-20 NOTE — Anesthesia Preprocedure Evaluation (Addendum)
Anesthesia Evaluation  ?Patient identified by MRN, date of birth, ID band ?Patient awake ? ? ? ?Reviewed: ?Allergy & Precautions, NPO status , Patient's Chart, lab work & pertinent test results ? ?History of Anesthesia Complications ?(+) DIFFICULT AIRWAY and history of anesthetic complications (Patient states he has a difficult intubation letter from his nephrectomy surgery which he did not bring) ? ?Airway ?Mallampati: II ? ?TM Distance: >3 FB ?Neck ROM: Full ? ? ?Comment: Airway note from nephrectomy in 2020:  ?Endotracheal tube insertion site: oral ?Blade: Macintosh ?Blade size: #3 ?ETT size: 7.0 mm ?Cuffed: yes ?View (Cormack Lehane grade): grade III - view of epiglottis only ?Placement verified by: chest auscultation/breath sounds equal bilaterally and capnometry/+EtCO2  ?Measured from: lips ?ETT to lips (cm): 23 ? ?Number of attempts at approach: 3 or more ?Ventilation between attempts: none ?Number of other approaches attempted: 3 or more ?Airway placement trauma: none ?Additional Comments ?First attempt by [facility]-1 with Mac 3. Only able to obtain g3v, anterior airway. Second attempt with Sabra Heck 2 by CA-1, large epiglottis continuously dropping into view. Third attempt by CA-1 with Mac 3 and bougie, unable to feel tracheal rings. Fourth attempt by Venora Maples, MD with Mac 3, g3v. Fifth attempt by [facility] with Sabra Heck 2, large epiglottis. Final attempt by [facility] with Mac 3, g3v. Airway much more anterior than suspected based on airway exam (TM 3). Would recommend Glidescope/CMAC with D blade for future initial attempts.  ? Dental ?no notable dental hx. ? ?  ?Pulmonary ?neg pulmonary ROS, former smoker,  ?  ?Pulmonary exam normal ?breath sounds clear to auscultation ? ? ? ? ? ? Cardiovascular ?hypertension, negative cardio ROS ?Normal cardiovascular exam ?Rhythm:Regular Rate:Normal ? ? ?  ?Neuro/Psych ? Neuromuscular disease (peripheral neuropathy) negative psych ROS  ? GI/Hepatic ?Neg  liver ROS, hiatal hernia, GERD  ,  ?Endo/Other  ?negative endocrine ROSdiabetes, Type 2, Insulin Dependent ? Renal/GU ?Renal disease (h/o renal cell cancer s/p right partial nephrectomy)  ?negative genitourinary ?  ?Musculoskeletal ? ?(+) Arthritis ,  ? Abdominal ?  ?Peds ?negative pediatric ROS ?(+)  Hematology ?negative hematology ROS ?(+)   ?Anesthesia Other Findings ? ? Reproductive/Obstetrics ?negative OB ROS ? ?  ? ? ? ? ? ? ? ? ? ? ? ? ? ?  ?  ? ? ? ? ? ? ? ?Anesthesia Physical ?Anesthesia Plan ? ?ASA: 2 ? ?Anesthesia Plan: General  ? ?Post-op Pain Management:   ? ?Induction: Intravenous ? ?PONV Risk Score and Plan: 2 and Treatment may vary due to age or medical condition, Ondansetron and Dexamethasone ? ?Airway Management Planned: Oral ETT and Video Laryngoscope Planned ? ?Additional Equipment: None ? ?Intra-op Plan:  ? ?Post-operative Plan: Extubation in OR ? ?Informed Consent: I have reviewed the patients History and Physical, chart, labs and discussed the procedure including the risks, benefits and alternatives for the proposed anesthesia with the patient or authorized representative who has indicated his/her understanding and acceptance.  ? ? ? ?Dental advisory given ? ?Plan Discussed with: CRNA, Anesthesiologist and Surgeon ? ?Anesthesia Plan Comments: (Glidescope given history of difficult intubation in 2020. Norton Blizzard, MD  ?)  ? ? ? ? ? ?Anesthesia Quick Evaluation ? ?

## 2021-07-20 NOTE — Anesthesia Postprocedure Evaluation (Signed)
Anesthesia Post Note ? ?Patient: Curtis Hutchinson ? ?Procedure(s) Performed: LAPAROSCOPIC LEFT INGUINAL HERNIA REPAIR WITH MESH (Left) ? ?  ? ?Patient location during evaluation: PACU ?Anesthesia Type: General ?Level of consciousness: awake ?Pain management: pain level controlled ?Vital Signs Assessment: post-procedure vital signs reviewed and stable ?Respiratory status: spontaneous breathing and respiratory function stable ?Cardiovascular status: stable ?Postop Assessment: no apparent nausea or vomiting ?Anesthetic complications: no ? ? ?No notable events documented. ? ?Last Vitals:  ?Vitals:  ? 07/20/21 1215 07/20/21 1230  ?BP: 140/78 (!) 142/77  ?Pulse: 87 84  ?Resp: 17 10  ?Temp:    ?SpO2: 96% 94%  ?  ?Last Pain:  ?Vitals:  ? 07/20/21 1230  ?TempSrc:   ?PainSc: 0-No pain  ? ? ?  ?  ?  ?  ?  ?  ? ?Mellody Dance ? ? ? ? ?

## 2021-07-23 ENCOUNTER — Encounter (HOSPITAL_BASED_OUTPATIENT_CLINIC_OR_DEPARTMENT_OTHER): Payer: Self-pay | Admitting: Surgery

## 2022-02-12 IMAGING — MR MR PROSTATE WO/W CM
12 series · 48 of 48 positions shown · IV contrast (multihance)
Comparison: None.

CLINICAL DATA: Elevated PSA.

EXAM:
MR PROSTATE WITHOUT AND WITH CONTRAST
TECHNIQUE: Multiplanar multisequence MRI images were obtained of the pelvis
centered about the prostate. Pre and post contrast images were
obtained.
CONTRAST:  16mL MULTIHANCE GADOBENATE DIMEGLUMINE 529 MG/ML IV SOLN

[Series 3: T2 · coronal · 3.0mm · 0.56mm/px · 1 of 23 slices shown (1 of 3)]
[im 1/23]
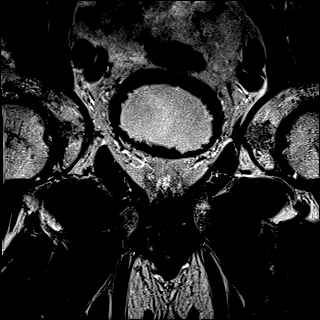

[Series 4: T1 · axial · 5.0mm · 1.25mm/px · 1 of 80 slices shown]
[im 1/80]
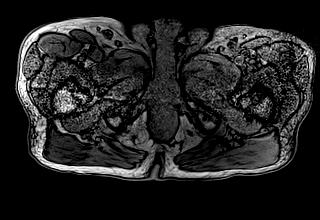

[Series 5: DWI · axial · 3.0mm · 1.75mm/px · 1 of 93 slices shown (1 of 3)]
[im 1/93]
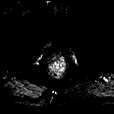

[Series 6: DWI · axial · 3.0mm · 1.75mm/px · 1 of 31 slices shown (2 of 3)]
[im 1/31]
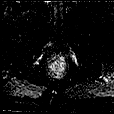

[Series 7: DWI · axial · 3.0mm · 1.75mm/px · 1 of 31 slices shown (3 of 3)]
[im 1/31]
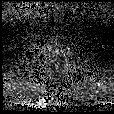

[Series 8: T2 · axial · 3.0mm · 0.56mm/px · 1 of 28 slices shown (2 of 3)]
[im 1/28]
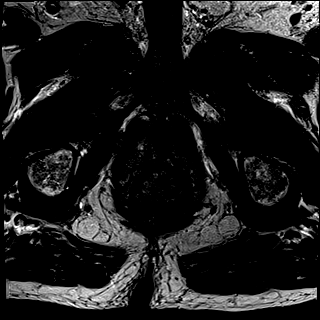

[Series 9: T2 · axial · 1.0mm · 1.04mm/px · z∈[-19,+68]mm · 2 of 88 slices shown (3 of 3)]
[im 1/88]
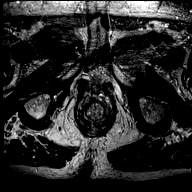
[im 88/88]
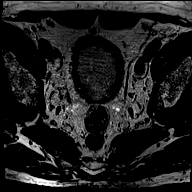

[Series 10: pre t1_twist_tra_dyn · axial · non-contrast · 3.5mm · 0.83mm/px · 1 of 26 slices shown]
[im 1/26]
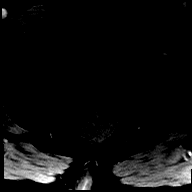

[Series 11: post t1_twist_tra_dyn-copy center · axial · non-contrast · 3.5mm · 0.83mm/px · z∈[-24,+63]mm · 18 of 780 slices shown]
[im 1/780]
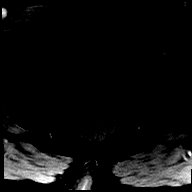
[im 46/780]
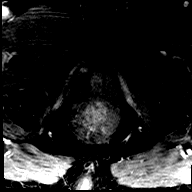
[im 92/780]
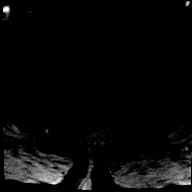
[im 138/780]
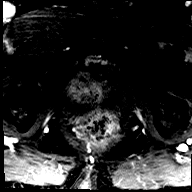
[im 184/780]
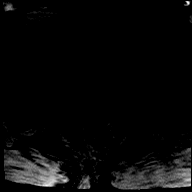
[im 230/780]
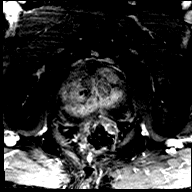
[im 275/780]
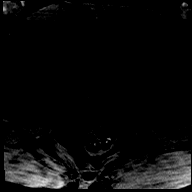
[im 321/780]
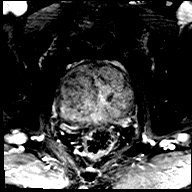
[im 367/780]
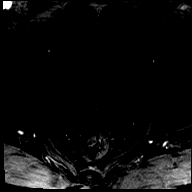
[im 413/780]
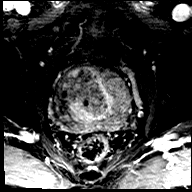
[im 459/780]
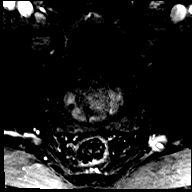
[im 505/780]
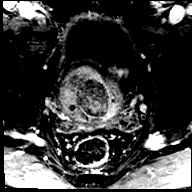
[im 550/780]
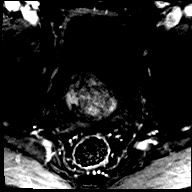
[im 596/780]
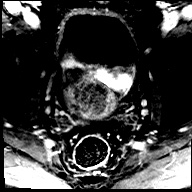
[im 642/780]
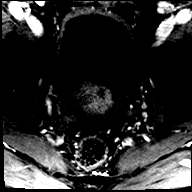
[im 688/780]
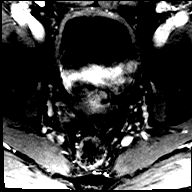
[im 734/780]
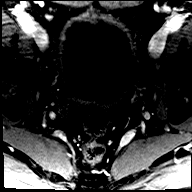
[im 780/780]
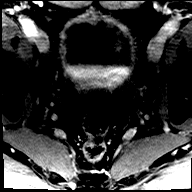

[Series 12: post t1_twist_tra_dyn-copy cent_sub · axial · 3.5mm · 0.83mm/px · z∈[-24,+63]mm · 17 of 754 slices shown]
[im 1/754]
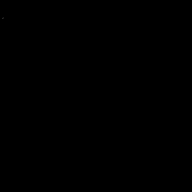
[im 48/754]
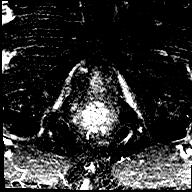
[im 95/754]
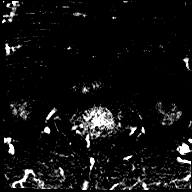
[im 142/754]
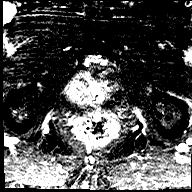
[im 189/754]
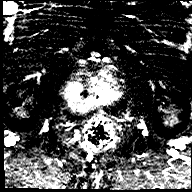
[im 236/754]
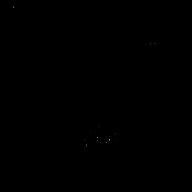
[im 283/754]
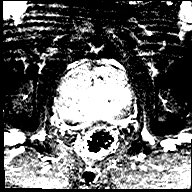
[im 330/754]
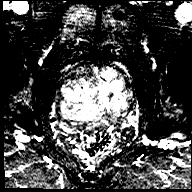
[im 377/754]
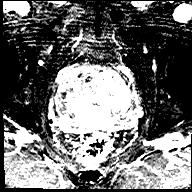
[im 424/754]
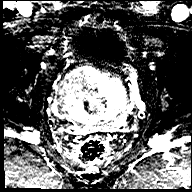
[im 471/754]
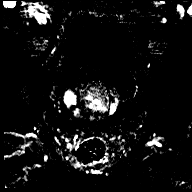
[im 518/754]
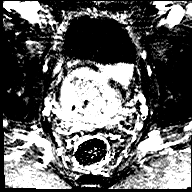
[im 565/754]
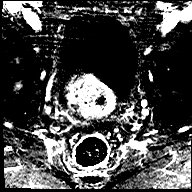
[im 612/754]
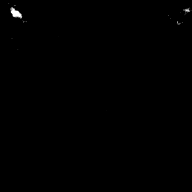
[im 659/754]
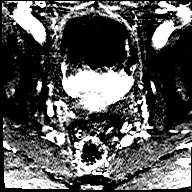
[im 706/754]
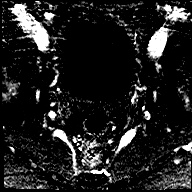
[im 754/754]
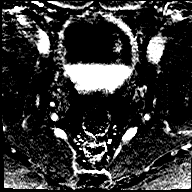

[Series 13: t1_vibe_dixon_tra_f · axial · 2.5mm · 0.91mm/px · z∈[-22,+175]mm · 2 of 80 slices shown]
[im 1/80]
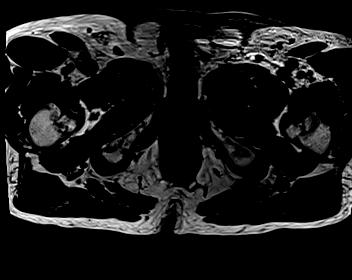
[im 80/80]
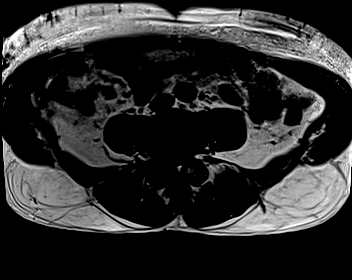

[Series 14: t1_vibe_dixon_tra_w · axial · 2.5mm · 0.91mm/px · z∈[-22,+175]mm · 2 of 80 slices shown]
[im 1/80]
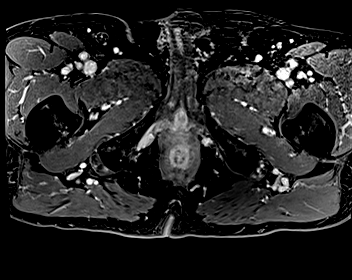
[im 80/80]
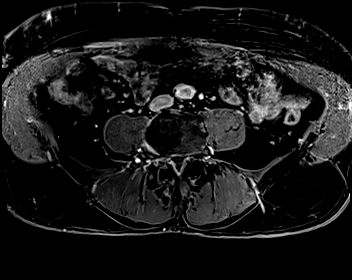

[48 of 48 positions shown; findings below may reference images not displayed]

FINDINGS: Prostate:

-- Peripheral Zone: No abnormality seen on ADC and high b-value DWI
sequences.

-- Transition/Central Zone: Circumscribed BPH nodules are noted, but
no suspicious nodules with obscured or non-circumscribed margins
seen.

-- Measurements/Volume:  7.8 x 5.2 x 6.6 cm (volume = 140 cm^3)

Transcapsular spread:  Absent

Seminal vesicle involvement:  Absent

Neurovascular bundle involvement:  Absent

Pelvic adenopathy: None visualized

Bone metastasis: None visualized

Other: Diffuse bladder wall thickening, consistent with chronic
bladder outlet obstruction.
IMPRESSION: No radiographic evidence of high-grade prostate carcinoma. PI-RADS
1: Very Low (clinically significant cancer is highly unlikely to be
present)
